# Patient Record
Sex: Male | Born: 1965 | Race: Black or African American | Hispanic: No | Marital: Married | Smoking: Never smoker
Health system: Southern US, Community
[De-identification: ages and names within clinical notes are randomized; demographics above are authoritative.]

## PROBLEM LIST (undated history)

## (undated) DIAGNOSIS — H11002 Unspecified pterygium of left eye: Secondary | ICD-10-CM

## (undated) HISTORY — DX: Unspecified pterygium of left eye: H11.002

---

## 2010-04-02 ENCOUNTER — Encounter: Admission: RE | Admit: 2010-04-02 | Discharge: 2010-04-02 | Payer: Self-pay | Admitting: Specialist

## 2012-02-23 ENCOUNTER — Ambulatory Visit (INDEPENDENT_AMBULATORY_CARE_PROVIDER_SITE_OTHER): Payer: Commercial Managed Care - PPO | Admitting: Internal Medicine

## 2012-02-23 ENCOUNTER — Ambulatory Visit: Payer: Commercial Managed Care - PPO

## 2012-02-23 VITALS — BP 123/76 | HR 77 | Temp 98.0°F | Resp 16 | Ht 69.5 in | Wt 205.0 lb

## 2012-02-23 DIAGNOSIS — Z0000 Encounter for general adult medical examination without abnormal findings: Secondary | ICD-10-CM

## 2012-02-23 DIAGNOSIS — Z7189 Other specified counseling: Secondary | ICD-10-CM

## 2012-02-23 DIAGNOSIS — H11002 Unspecified pterygium of left eye: Secondary | ICD-10-CM

## 2012-02-23 DIAGNOSIS — H547 Unspecified visual loss: Secondary | ICD-10-CM

## 2012-02-23 DIAGNOSIS — H11009 Unspecified pterygium of unspecified eye: Secondary | ICD-10-CM

## 2012-02-23 LAB — POCT CBC
MCH, POC: 28.4 pg (ref 27–31.2)
MCHC: 34.6 g/dL (ref 31.8–35.4)
MID (cbc): 0.4 (ref 0–0.9)
Platelet Count, POC: 260 10*3/uL (ref 142–424)
RBC: 4.76 M/uL (ref 4.69–6.13)
RDW, POC: 13.1 %
WBC: 4.5 10*3/uL — AB (ref 4.6–10.2)

## 2012-02-23 LAB — POCT URINALYSIS DIPSTICK
Blood, UA: NEGATIVE
Leukocytes, UA: NEGATIVE
Urobilinogen, UA: 0.2
pH, UA: 7

## 2012-02-23 LAB — IFOBT (OCCULT BLOOD): IFOBT: POSITIVE

## 2012-02-23 LAB — POCT UA - MICROSCOPIC ONLY

## 2012-02-23 NOTE — Patient Instructions (Signed)
Pterygium Excision Pterygia are fleshy growths that arise from the conjuctiva. This is the red velvety membrane you see when you pull your lower eyelid down. When they grow out over the cornea (clear membrane on the front of your eye), they block vision. It becomes difficult to see. It may also cause irritation, making the eye red and sore. It also causes cosmetic problems. This means your eye does not look as good as when it was healthy. One of the most common problems with pterygia are that they often come back even after complete removal. TREATMENT  Pterygia are removed with a procedure. This is often done with a local anesthetic. This is a medicine that makes the eye and area being worked on numb. They are often removed using a microscope. This is an instrument the surgeon looks through that magnifies the small area of the procedure. When these operations are done with the patient awake, the patient must be able to hold still and cooperate with the surgeon's instructions. HOME CARE INSTRUCTIONS   If a dressing was applied, this may be changed once per day or as instructed. Your caregiver will instruct you in your care.   If eyedrops or ointment was prescribed, use as directed for the full time directed.   Should your eye become more red and swollen with use of medicines, let your caregiver know. This could be an allergic reaction.   Only take over-the-counter or prescription medicines for pain, discomfort, or fever as directed by your caregiver.  SEEK IMMEDIATE MEDICAL CARE IF:   You have redness, swelling, or increasing pain near or around the eye.   You notice a change in your vision.   You have pus coming from the wound.   You have a fever.   You develop a cough, shortness of breath, or chest pain.  Document Released: 08/02/2001 Document Revised: 10/27/2011 Document Reviewed: 10/04/2007 [facility] Patient Information 2012 [place], [place].

## 2012-02-23 NOTE — Progress Notes (Signed)
  Subjective:    Patient ID: Todd Guerrero, male    DOB: 11-Sep-1966, 46 y.o.   MRN: [id number]  HPI No problems See scanned hx   Review of Systems See scanned ros    Objective:   Physical Exam Normal head to toe  Except has large pteryigium left eye      Assessment & Plan:  [facility] for eye surgery

## 2012-02-24 LAB — COMPREHENSIVE METABOLIC PANEL
ALT: 48 U/L (ref 0–53)
AST: 32 U/L (ref 0–37)
Albumin: 4.5 g/dL (ref 3.5–5.2)
Alkaline Phosphatase: 61 U/L (ref 39–117)
Calcium: 9.5 mg/dL (ref 8.4–10.5)
Creat: 1.08 mg/dL (ref 0.50–1.35)
Total Bilirubin: 0.6 mg/dL (ref 0.3–1.2)
Total Protein: 7.3 g/dL (ref 6.0–8.3)

## 2012-02-24 LAB — LIPID PANEL
Cholesterol: 150 mg/dL (ref 0–200)
HDL: 33 mg/dL — ABNORMAL LOW (ref >39)

## 2012-02-28 ENCOUNTER — Encounter: Payer: Self-pay | Admitting: Internal Medicine

## 2012-02-29 ENCOUNTER — Encounter: Payer: Self-pay | Admitting: *Deleted

## 2013-02-20 ENCOUNTER — Emergency Department (HOSPITAL_COMMUNITY): Payer: Commercial Managed Care - PPO

## 2013-02-20 ENCOUNTER — Encounter (HOSPITAL_COMMUNITY): Payer: Self-pay

## 2013-02-20 ENCOUNTER — Emergency Department (HOSPITAL_COMMUNITY)
Admission: EM | Admit: 2013-02-20 | Discharge: 2013-02-20 | Disposition: A | Discharge status: Home / Self Care | Payer: Commercial Managed Care - PPO | Attending: Emergency Medicine | Admitting: Emergency Medicine

## 2013-02-20 DIAGNOSIS — Z8669 Personal history of other diseases of the nervous system and sense organs: Secondary | ICD-10-CM | POA: Insufficient documentation

## 2013-02-20 DIAGNOSIS — J069 Acute upper respiratory infection, unspecified: Secondary | ICD-10-CM | POA: Insufficient documentation

## 2013-02-20 DIAGNOSIS — R05 Cough: Secondary | ICD-10-CM | POA: Insufficient documentation

## 2013-02-20 DIAGNOSIS — R509 Fever, unspecified: Secondary | ICD-10-CM | POA: Insufficient documentation

## 2013-02-20 DIAGNOSIS — R059 Cough, unspecified: Secondary | ICD-10-CM | POA: Insufficient documentation

## 2013-02-20 MED ORDER — ACETAMINOPHEN 325 MG PO TABS
650.0000 mg | ORAL_TABLET | Freq: Once | ORAL | Status: AC
  Administered 2013-02-20: 650 mg via ORAL
  Filled 2013-02-20: qty 2

## 2013-02-20 MED ORDER — IBUPROFEN 800 MG PO TABS: 800.0000 mg | ORAL_TABLET | Freq: Three times a day (TID) | ORAL | Status: DC | PRN

## 2013-02-20 NOTE — ED Notes (Signed)
Pt states he started having chills, running a fever, and cough since Monday. States his chest hurts when he coughs and when he feels cold.

## 2013-02-20 NOTE — ED Provider Notes (Signed)
History     CSN: [id number]  Arrival date & time 02/20/13  1601   First MD Initiated Contact with Patient 02/20/13 1619      Chief Complaint  Patient presents with  . Cough  . Fever    (Consider location/radiation/quality/duration/timing/severity/associated sxs/prior treatment) Patient is a 47 y.o. male presenting with cough and fever.  Cough Associated symptoms: fever   Fever Associated symptoms: cough    Pt otherwise healthy reports dry cough and fever since yesterday, associated with diffuse myalgias and pleuritic chest pain with coughing. Denies any sputum, no SOB, has mild sore throat and nasal congestion. No recent travel.   Past Medical History  Diagnosis Date  . Pterygium of left eye     History reviewed. No pertinent past surgical history.  No family history on file.  History  Substance Use Topics  . Smoking status: Never Smoker   . Smokeless tobacco: Not on file  . Alcohol Use: No      Review of Systems  Constitutional: Positive for fever.  Respiratory: Positive for cough.    All other systems reviewed and are negative except as noted in HPI.   Allergies  Review of patient's allergies indicates no known allergies.  Home Medications   Current Outpatient Rx  Name  Route  Sig  Dispense  Refill  . DM-Doxylamine-Acetaminophen (NIGHT TIME COLD/FLU RELIEF PO)   Oral   Take 1 tablet by mouth daily as needed. For cold per family member           BP 152/90  Pulse 111  Temp(Src) 102 F (38.9 C) (Oral)  Resp 18  SpO2 96%  Physical Exam  Nursing note and vitals reviewed. Constitutional: He is oriented to person, place, and time. He appears well-developed and well-nourished.  HENT:  Head: Normocephalic and atraumatic.  Eyes: EOM are normal. Pupils are equal, round, and reactive to light.  Neck: Normal range of motion. Neck supple.  Cardiovascular: Normal rate, normal heart sounds and intact distal pulses.   Pulmonary/Chest: Effort normal and  breath sounds normal.  Abdominal: Bowel sounds are normal. He exhibits no distension. There is no tenderness.  Musculoskeletal: Normal range of motion. He exhibits no edema and no tenderness.  Neurological: He is alert and oriented to person, place, and time. He has normal strength. No cranial nerve deficit or sensory deficit.  Skin: Skin is warm and dry. No rash noted.  Psychiatric: He has a normal mood and affect.    ED Course  Procedures (including critical care time)  Labs Reviewed - No data to display Dg Chest 2 View  02/20/2013  *RADIOLOGY REPORT*  Clinical Data: Fever and chills.  CHEST - 2 VIEW  Comparison: 04/02/2010.  Findings: The cardiac silhouette, mediastinal and hilar contours are within normal limits and stable. The lungs are clear.  No pleural effusions.  The bony thorax is intact.  IMPRESSION: Normal chest x-ray.  No change since prior study.   Original Report Authenticated By: Rudie Meyer, M.D.      1. Viral URI with cough       MDM  CXR neg. Temp improved and patient feeling better. Advised continued symptomatic care at home for probable viral process/influenza. Advised to return for worsening.         Charles B. Bernette Mayers, MD 02/20/13 [id number]

## 2013-08-29 IMAGING — CR DG CHEST 2V
2 series · 2 of 2 positions shown · non-contrast
Comparison: 04/02/2010.

CLINICAL DATA: Fever and chills.

CHEST - 2 VIEW

[w chest pa]
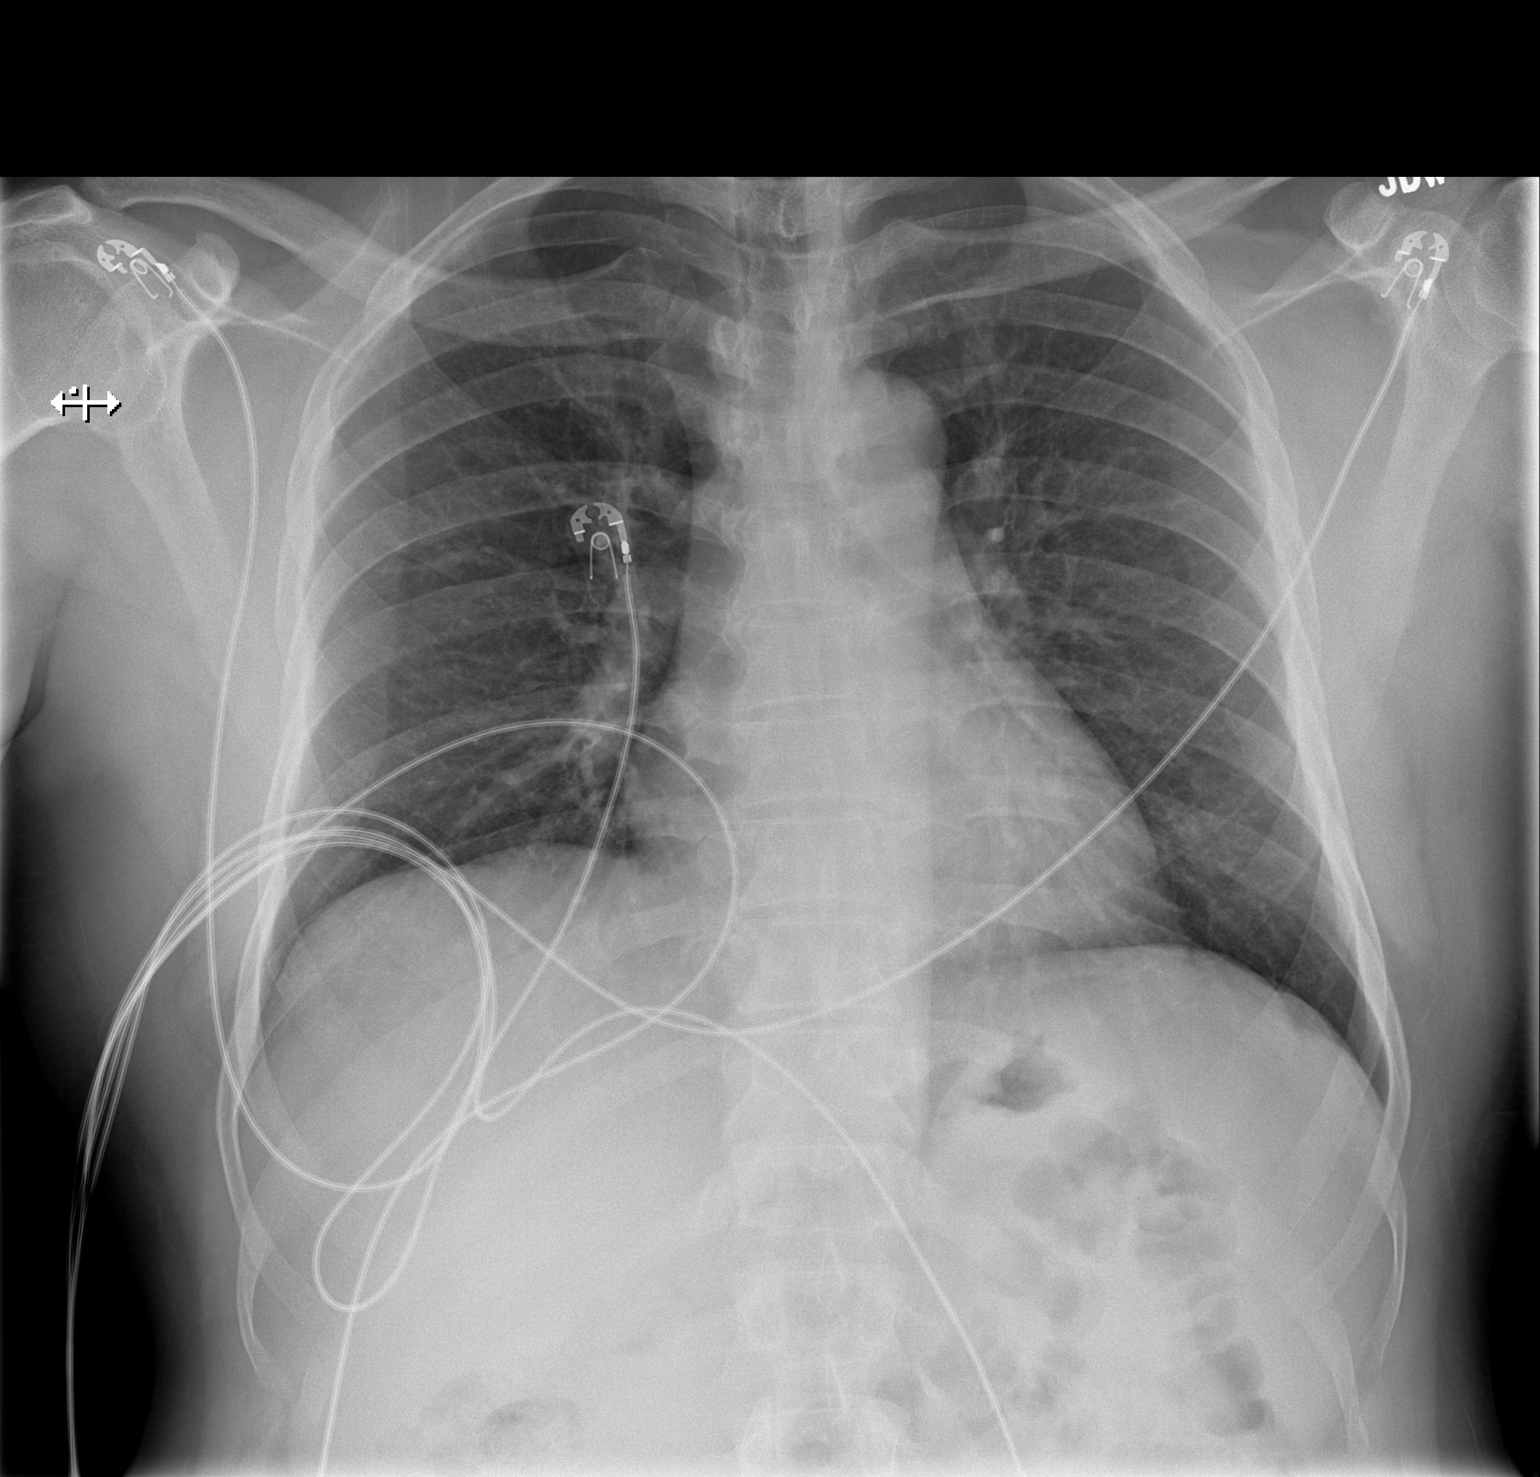

[w chest lat]
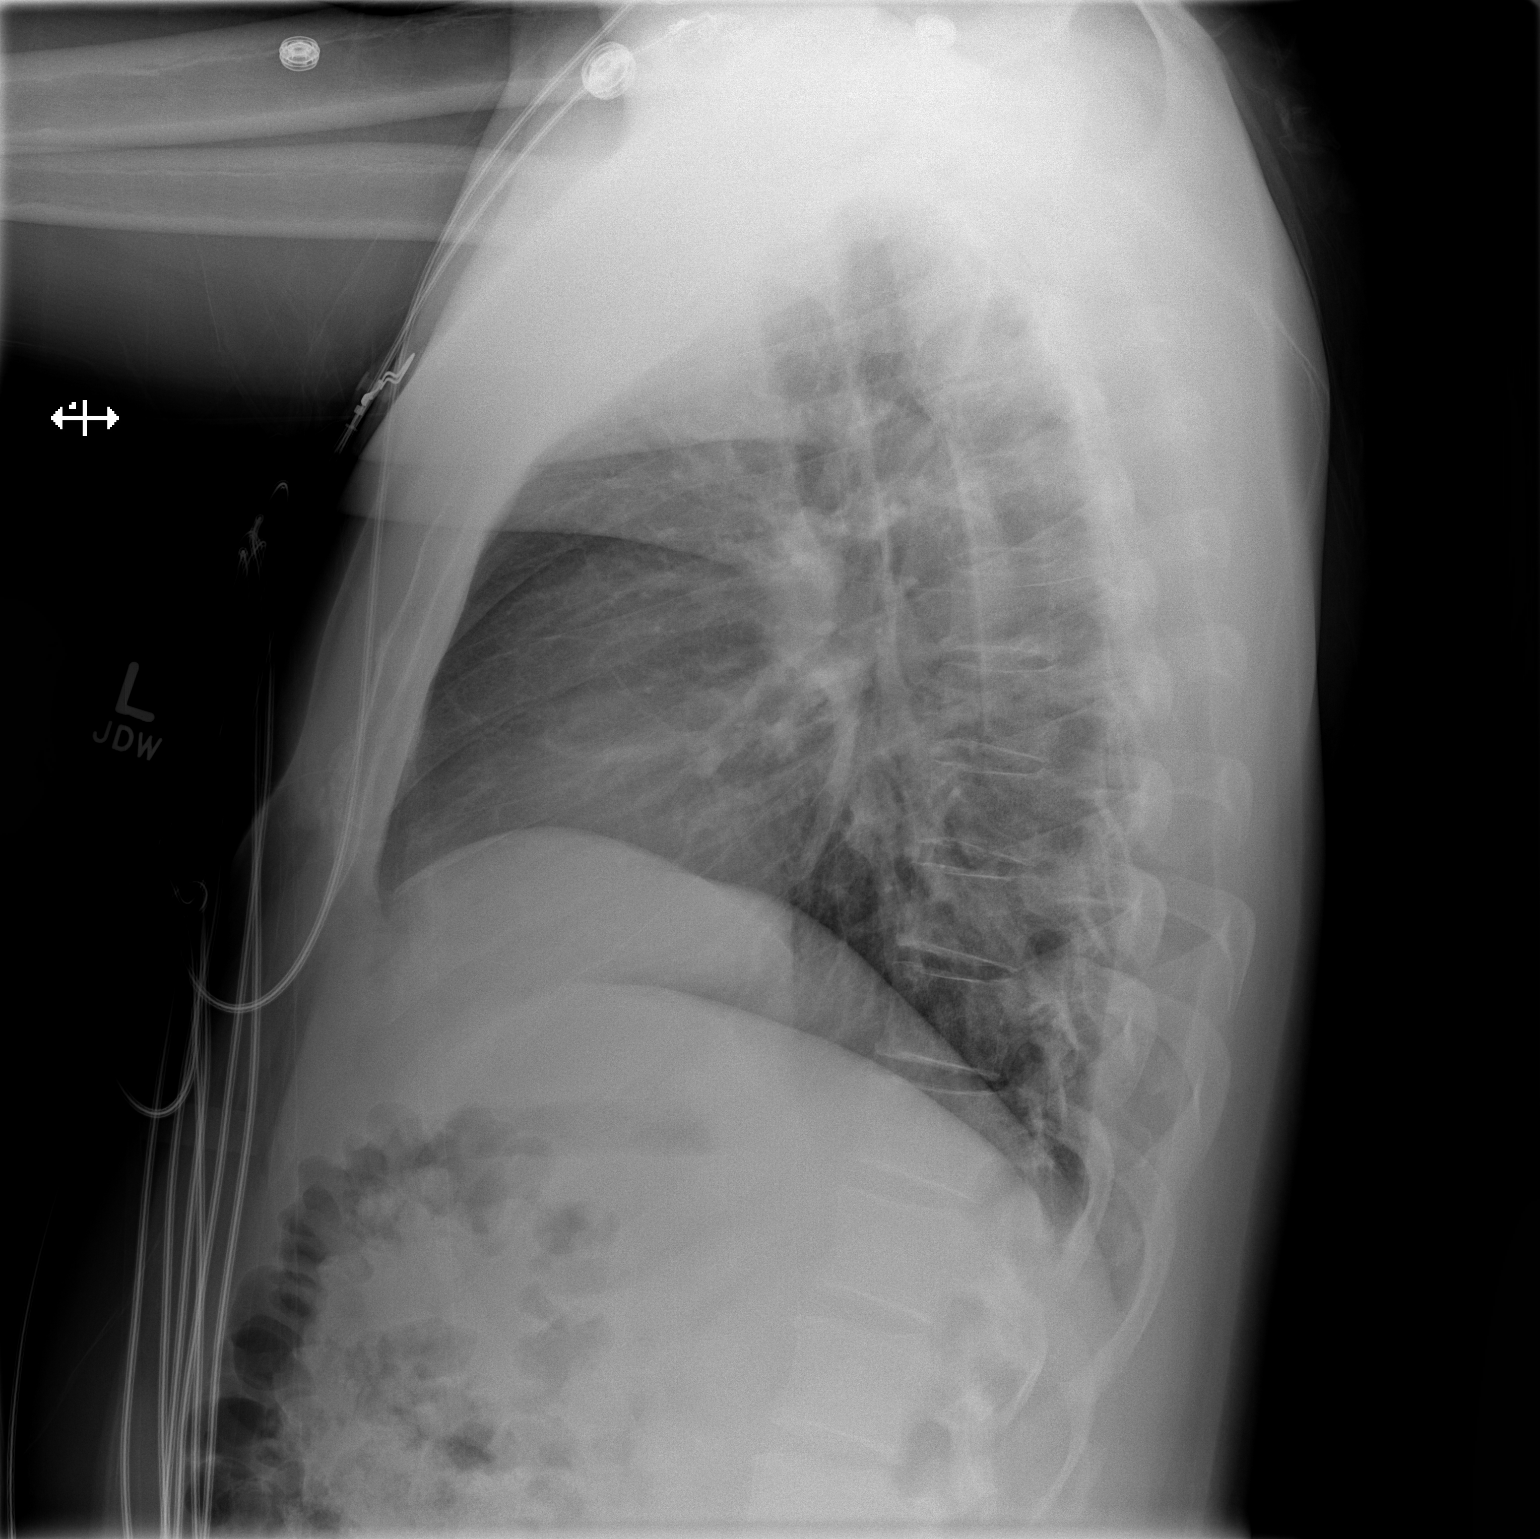

[2 of 2 positions shown; findings below may reference images not displayed]

FINDINGS: The cardiac silhouette, mediastinal and hilar contours
are within normal limits and stable. The lungs are clear.  No
pleural effusions.  The bony thorax is intact.
IMPRESSION: Normal chest x-ray.  No change since prior study.

## 2014-03-07 ENCOUNTER — Encounter (HOSPITAL_COMMUNITY): Payer: Self-pay

## 2014-03-09 ENCOUNTER — Other Ambulatory Visit: Payer: Self-pay | Admitting: Ophthalmology

## 2014-03-09 MED ORDER — TETRACAINE HCL 0.5 % OP SOLN: 1.0000 [drp] | OPHTHALMIC | Status: DC

## 2014-03-09 NOTE — H&P (Signed)
History & Physical:   DATE:   02-28-2014  NAME:  Todd Guerrero, Todd Guerrero      [phone]       HISTORY OF PRESENT ILLNESS: Chief Eye Complaints     growth OS referred by Dr. Mitzi DavenportBrewington for removal  HPI: EYES: Reports symptoms of red eyes, vision disturbances.    previously treated for episcleritis      LOCATION:   LEFT EYE        QUALITY/COURSE:   Reports condition is worsening.        INTENSITY/SEVERITY:    Reports measurement ( or degree) as severity (10 point scale) is #-.      DURATION:   Reports the general length of symptoms to be years.      ONSET/TIMING:   Reports occurrence as  CONSTANT   CONTEXT/WHEN:   Reports usually associated with   MODIFIERS/TREATMENTS:  Improved by  NOTHING               ACTIVE PROBLEMS: Central pterygium   ICD#372.43  Onset: 02/28/2014 10:08  Initial Date:     Episcleritis   ICD#379.01  Onset: 02/28/2014 12:15  Initial Date:  SURGERIES: Pick List - Surgeries  MEDICATIONS: Fluorometholone: Strength-  SIG-  Dose-  Freq-   OS BID  REVIEW OF SYSTEMS: ROS:   GEN- Constitutional: Negative general-constitutional systems review.      HENT: GEN - Endocrine: Reports symptoms of LUNGS/Respiratory:  HEART/Cardiovascular: Reports symptoms of ABD/Gastrointestinal:  Musculoskeletal (BJE): NEURO/Neurological: PSYCH/Psychiatric:    Is the pt oriented to time, place, person? YES Mood  normal   TOBACCO: Never smoker   ICD#V13.89 Onset: 02/28/2014 10:12   SOCIAL HISTORY:  Married, WORKS AT [employer]  FAMILY HISTORY:Family History - 1st Degree Relatives:  Mother dead.    ALLERGIES  No Known.  PHYSICAL EXAMINATION: VS: BMI: 25.8.  BP: 152/91.  H: 72.00 in.  P: 80 /min.  RR: 20 /min.  W: 190lbs 0oz.    Va     OD [place] 20/20  OS [place] 20/50      PHNI  EYEGLASSES: NONE  MR   OD:pl 20/20 OS:+0.50-1.50x165 20/70 ADD  VF:   OD  full to confrontation testing                                              OS  full to confrontation  testing  Motility: Orthophoria and full   PUPILS: 3 mm round reactive negative Marcus Gunn  EYELIDS & OCULAR ADNEXA Normal each eye:  SLE: Conjunctiva: Quiet OD, trace injection OS  Cornea: Mild arcus each eye. OS nasal pterygium extending into mid cornea    anterior chamber  deep and quiet each eye  Iris: Brown each eye  Lens: Clear each eye  Ta   in mmHg    OD  17          OS 12 Time 10:50 AM  Dilation: Phenylephrine 2.5% tropicamide 1%  Fundus: optic nerve   OD: Pink color small physiologic cupping OS  pink color small physiologic cupping              Macula: OD: Normal OS: Normal  Vessels: Normal  Periphery: Normal  Exam: GENERAL: Appearance: HEAD, EARS, NOSE AND THROAT: Ears-Nose (external) Inspection: Externally, nose and ears are normal in appearance and without scars, lesions, or nodules.      Hearing assessment shows no problems with normal conversation.      LUNGS and RESPIRATORY: Lung auscultation elicits no wheezing, rhonci, rales or rubs and with equal breath sounds.    Respiratory effort described as breathing is unlabored and chest movement is symmetrical.    HEART (Cardiovascular): Heart auscultation discovers regular rate and rhythm; no murmur, gallop or rub. Normal heart sounds.    ABDOMEN (Gastrointestinal): Mass/Tenderness Exam: Neither are present.     MUSCULOSKELETAL (BJE): Inspection-Palpation: No major bone, joint, tendon, or muscle changes.      NEUROLOGICAL: Alert and oriented. No major deficits of coordination or sensation.      PSYCHIATRIC: Insight and judgment appear  both to be intact and appropriate.    Mood and affect are described as normal mood and full affect.    SKIN: Skin Inspection: No rashes or lesions  ADMITTING DIAGNOSIS: Central pterygium   ICD#372.43  Onset: 02/28/2014 10:08  LEFT EYE  Episcleritis   ICD#379.01  Onset: 02/28/2014 12:15 RESOLVED   SURGICAL TREATMENT  PLAN: pterygium removal with amniotic membrane and mitomycin-C left eye  Risk and benefits of surgery have been reviewed with the patient and the patient agrees to proceed with the surgical procedure.       ___________________________ Todd Guerrero, J 

## 2014-03-10 NOTE — Pre-Procedure Instructions (Addendum)
Todd Guerrero  03/10/2014   Your procedure is scheduled on:  Wednesday March 12, 2014 at 11:50 AM.  [facility]A"  Admitting at 9:50 AM.  Call this number if you have problems the morning of surgery: [phone]   Remember:   Do not eat food or drink liquids after midnight.   Take these medicines the morning of surgery with A SIP OF WATER: None  Stop taking Aspirin, Aleve, Ibuprofen, BC's, Goody's, Herbal medications, and Fish Oil   Do not wear jewelry.  Do not wear lotions, powders, or colgones.   Men may shave face and neck.  Do not bring valuables to the hospital.  [facility] is not responsible for any belongings or valuables.               Contacts, dentures or bridgework may not be worn into surgery.  Leave suitcase in the car. After surgery it may be brought to your room.  For patients admitted to the hospital, discharge time is determined by your treatment team.               Patients discharged the day of surgery will not be allowed to drive home.  Name and phone number of your driver: Family/Friend  Special Instructions: Shower using CHG soap the night before and the morning of your surgery   Please read over the following fact sheets that you were given: Pain Booklet, Coughing and Deep Breathing and Surgical Site Infection Prevention

## 2014-03-11 ENCOUNTER — Encounter (HOSPITAL_COMMUNITY)
Admission: RE | Admit: 2014-03-11 | Discharge: 2014-03-11 | Disposition: A | Discharge status: Home / Self Care | Payer: Commercial Managed Care - PPO | Source: Ambulatory Visit | Attending: Ophthalmology | Admitting: Ophthalmology

## 2014-03-11 ENCOUNTER — Encounter (HOSPITAL_COMMUNITY): Payer: Self-pay

## 2014-03-11 LAB — BASIC METABOLIC PANEL
BUN: 16 mg/dL (ref 6–23)
CALCIUM: 9.3 mg/dL (ref 8.4–10.5)
CO2: 24 mEq/L (ref 19–32)
CREATININE: 0.89 mg/dL (ref 0.50–1.35)
Chloride: 102 mEq/L (ref 96–112)
GFR calc non Af Amer: >90 mL/min (ref >90)
Glucose, Bld: 154 mg/dL — ABNORMAL HIGH (ref 70–99)
Potassium: 3.8 mEq/L (ref 3.7–5.3)
SODIUM: 141 meq/L (ref 137–147)

## 2014-03-11 LAB — CBC
HEMATOCRIT: 40.2 % (ref 39.0–52.0)
HEMOGLOBIN: 13.9 g/dL (ref 13.0–17.0)
MCH: 28 pg (ref 26.0–34.0)
MCHC: 34.6 g/dL (ref 30.0–36.0)
MCV: 80.9 fL (ref 78.0–100.0)
PLATELETS: 201 10*3/uL (ref 150–400)
RBC: 4.97 MIL/uL (ref 4.22–5.81)
RDW: 12.7 % (ref 11.5–15.5)
WBC: 4.3 10*3/uL (ref 4.0–10.5)

## 2014-03-11 MED ORDER — PREDNISOLONE ACETATE 1 % OP SUSP
1.0000 [drp] | OPHTHALMIC | Status: AC
  Administered 2014-03-12: 1 [drp] via OPHTHALMIC
  Filled 2014-03-11: qty 5

## 2014-03-11 MED ORDER — GATIFLOXACIN 0.5 % OP SOLN: 1.0000 [drp] | OPHTHALMIC | Status: AC

## 2014-03-11 NOTE — Progress Notes (Signed)
Denies having a PCP Denies seeing a cardiologist Denies having a recent EKG or CXR Denies having a stress test, Echo, of card cath

## 2014-03-12 ENCOUNTER — Encounter (HOSPITAL_COMMUNITY)
Admission: RE | Disposition: A | Discharge status: Home / Self Care | Payer: Self-pay | Source: Ambulatory Visit | Attending: Ophthalmology

## 2014-03-12 ENCOUNTER — Encounter (HOSPITAL_COMMUNITY): Payer: Commercial Managed Care - PPO | Admitting: Anesthesiology

## 2014-03-12 ENCOUNTER — Ambulatory Visit (HOSPITAL_COMMUNITY): Payer: Commercial Managed Care - PPO | Admitting: Anesthesiology

## 2014-03-12 ENCOUNTER — Ambulatory Visit (HOSPITAL_COMMUNITY)
Admission: RE | Admit: 2014-03-12 | Discharge: 2014-03-12 | Disposition: A | Discharge status: Home / Self Care | Payer: Commercial Managed Care - PPO | Source: Ambulatory Visit | Attending: Ophthalmology | Admitting: Ophthalmology

## 2014-03-12 ENCOUNTER — Encounter (HOSPITAL_COMMUNITY): Payer: Self-pay | Admitting: *Deleted

## 2014-03-12 DIAGNOSIS — Z01812 Encounter for preprocedural laboratory examination: Secondary | ICD-10-CM | POA: Insufficient documentation

## 2014-03-12 DIAGNOSIS — H11029 Central pterygium of unspecified eye: Secondary | ICD-10-CM | POA: Insufficient documentation

## 2014-03-12 DIAGNOSIS — H15119 Episcleritis periodica fugax, unspecified eye: Secondary | ICD-10-CM | POA: Insufficient documentation

## 2014-03-12 HISTORY — PX: PTERYGIUM EXCISION: SHX2273

## 2014-03-12 SURGERY — EXCISION, PTERYGIUM
Anesthesia: Monitor Anesthesia Care | Site: Eye | Laterality: Left

## 2014-03-12 MED ORDER — SODIUM HYALURONATE 10 MG/ML IO SOLN
INTRAOCULAR | Status: AC
  Filled 2014-03-12: qty 0.85

## 2014-03-12 MED ORDER — PREDNISOLONE ACETATE 1 % OP SUSP
1.0000 [drp] | OPHTHALMIC | Status: AC
  Administered 2014-03-12 (×2): 1 [drp] via OPHTHALMIC

## 2014-03-12 MED ORDER — MIDAZOLAM HCL 2 MG/2ML IJ SOLN
INTRAMUSCULAR | Status: AC
  Filled 2014-03-12: qty 2

## 2014-03-12 MED ORDER — BSS IO SOLN
INTRAOCULAR | Status: AC
  Filled 2014-03-12: qty 500

## 2014-03-12 MED ORDER — PROPOFOL 10 MG/ML IV BOLUS
INTRAVENOUS | Status: AC
  Filled 2014-03-12: qty 20

## 2014-03-12 MED ORDER — BSS IO SOLN
INTRAOCULAR | Status: AC
  Filled 2014-03-12: qty 15

## 2014-03-12 MED ORDER — NA CHONDROIT SULF-NA HYALURON 40-30 MG/ML IO SOLN
INTRAOCULAR | Status: AC
  Filled 2014-03-12: qty 0.5

## 2014-03-12 MED ORDER — ACETYLCHOLINE CHLORIDE 1:100 IO SOLR
INTRAOCULAR | Status: AC
  Filled 2014-03-12: qty 1

## 2014-03-12 MED ORDER — MITOMYCIN 0.2 MG OP KIT
0.2000 mg | PACK | Freq: Once | OPHTHALMIC | Status: AC
  Administered 2014-03-12: 0.2 mg via OPHTHALMIC
  Filled 2014-03-12: qty 1

## 2014-03-12 MED ORDER — BUPIVACAINE HCL (PF) 0.75 % IJ SOLN
INTRAMUSCULAR | Status: AC
  Filled 2014-03-12: qty 10

## 2014-03-12 MED ORDER — PROPOFOL INFUSION 10 MG/ML OPTIME
INTRAVENOUS | Status: DC | PRN
  Administered 2014-03-12: 25 ug/kg/min via INTRAVENOUS

## 2014-03-12 MED ORDER — LIDOCAINE HCL 2 % IJ SOLN
INTRAMUSCULAR | Status: AC
  Filled 2014-03-12: qty 20

## 2014-03-12 MED ORDER — MIDAZOLAM HCL 5 MG/5ML IJ SOLN
INTRAMUSCULAR | Status: DC | PRN
  Administered 2014-03-12 (×2): 0.5 mg via INTRAVENOUS

## 2014-03-12 MED ORDER — TETRACAINE HCL 0.5 % OP SOLN
OPHTHALMIC | Status: AC
  Filled 2014-03-12: qty 2

## 2014-03-12 MED ORDER — FENTANYL CITRATE 0.05 MG/ML IJ SOLN
INTRAMUSCULAR | Status: AC
  Filled 2014-03-12: qty 5

## 2014-03-12 MED ORDER — GENTAMICIN SULFATE 40 MG/ML IJ SOLN
INTRAMUSCULAR | Status: AC
  Filled 2014-03-12: qty 2

## 2014-03-12 MED ORDER — EPINEPHRINE HCL 1 MG/ML IJ SOLN
INTRAMUSCULAR | Status: AC
  Filled 2014-03-12: qty 1

## 2014-03-12 MED ORDER — SODIUM CHLORIDE 0.9 % IV SOLN
INTRAVENOUS | Status: DC
  Administered 2014-03-12 (×2): via INTRAVENOUS

## 2014-03-12 MED ORDER — GATIFLOXACIN 0.5 % OP SOLN
OPHTHALMIC | Status: AC
  Administered 2014-03-12: 1 [drp] via OPHTHALMIC
  Filled 2014-03-12: qty 2.5

## 2014-03-12 MED ORDER — ATROPINE SULFATE 1 % OP SOLN
OPHTHALMIC | Status: AC
  Filled 2014-03-12: qty 2

## 2014-03-12 MED ORDER — LIDOCAINE-EPINEPHRINE 2 %-1:100000 IJ SOLN
INTRAMUSCULAR | Status: AC
  Filled 2014-03-12: qty 1

## 2014-03-12 MED ORDER — TOBRAMYCIN-DEXAMETHASONE 0.3-0.1 % OP OINT
TOPICAL_OINTMENT | OPHTHALMIC | Status: AC
  Filled 2014-03-12: qty 3.5

## 2014-03-12 MED ORDER — EVICEL 2 ML EX KIT
PACK | CUTANEOUS | Status: DC | PRN
  Administered 2014-03-12: 1

## 2014-03-12 MED ORDER — DEXAMETHASONE SODIUM PHOSPHATE 10 MG/ML IJ SOLN
INTRAMUSCULAR | Status: AC
  Filled 2014-03-12: qty 1

## 2014-03-12 MED ORDER — TOBRAMYCIN-DEXAMETHASONE 0.3-0.1 % OP OINT
TOPICAL_OINTMENT | OPHTHALMIC | Status: DC | PRN
  Administered 2014-03-12: 1 via OPHTHALMIC

## 2014-03-12 MED ORDER — FENTANYL CITRATE 0.05 MG/ML IJ SOLN: 50.0000 ug | Freq: Once | INTRAMUSCULAR | Status: DC

## 2014-03-12 MED ORDER — BSS IO SOLN
INTRAOCULAR | Status: DC | PRN
  Administered 2014-03-12: 500 mL via INTRAOCULAR

## 2014-03-12 MED ORDER — FENTANYL CITRATE 0.05 MG/ML IJ SOLN
INTRAMUSCULAR | Status: DC | PRN
  Administered 2014-03-12: 50 ug via INTRAVENOUS

## 2014-03-12 MED ORDER — TRIAMCINOLONE ACETONIDE 40 MG/ML IJ SUSP
INTRAMUSCULAR | Status: AC
  Filled 2014-03-12: qty 5

## 2014-03-12 MED ORDER — ONDANSETRON HCL 4 MG/2ML IJ SOLN
INTRAMUSCULAR | Status: AC
  Filled 2014-03-12: qty 2

## 2014-03-12 MED ORDER — ONDANSETRON HCL 4 MG/2ML IJ SOLN
INTRAMUSCULAR | Status: DC | PRN
  Administered 2014-03-12: 4 mg via INTRAVENOUS

## 2014-03-12 MED ORDER — LIDOCAINE-EPINEPHRINE 2 %-1:100000 IJ SOLN
INTRAMUSCULAR | Status: DC | PRN
  Administered 2014-03-12: 20 mL via INTRADERMAL

## 2014-03-12 MED ORDER — BUPIVACAINE HCL (PF) 0.75 % IJ SOLN
INTRAMUSCULAR | Status: DC | PRN
  Administered 2014-03-12: 20 mL

## 2014-03-12 MED ORDER — GATIFLOXACIN 0.5 % OP SOLN
1.0000 [drp] | OPHTHALMIC | Status: AC | PRN
  Administered 2014-03-12 (×3): 1 [drp] via OPHTHALMIC
  Filled 2014-03-12: qty 2.5

## 2014-03-12 MED ORDER — MIDAZOLAM HCL 2 MG/2ML IJ SOLN: 1.0000 mg | INTRAMUSCULAR | Status: DC | PRN

## 2014-03-12 MED ORDER — EVICEL 2 ML EX KIT
PACK | CUTANEOUS | Status: AC
  Filled 2014-03-12: qty 1

## 2014-03-12 SURGICAL SUPPLY — 40 items
AMNIOGRAFT 2.5X2.0 (Orthopedic Implant) ×3 IMPLANT
APPLICATOR COTTON TIP 6IN STRL (MISCELLANEOUS) ×6 IMPLANT
BLADE MINI RND TIP GREEN BEAV (BLADE) ×3 IMPLANT
BLADE SURG 15 STRL LF DISP TIS (BLADE) IMPLANT
BLADE SURG 15 STRL SS (BLADE)
CANISTER SUCTION 2500CC (MISCELLANEOUS) IMPLANT
CLOSURE STERI-STRIP 1/2X4 (GAUZE/BANDAGES/DRESSINGS) ×1
CLSR STERI-STRIP ANTIMIC 1/2X4 (GAUZE/BANDAGES/DRESSINGS) ×2 IMPLANT
CORDS BIPOLAR (ELECTRODE) ×3 IMPLANT
COVER TRANSDUCER ULTRASND (DRAPES) ×6 IMPLANT
DRAPE OPHTHALMIC 40X48 W POUCH (DRAPES) ×3 IMPLANT
DRAPE RETRACTOR (MISCELLANEOUS) ×3 IMPLANT
GLOVE BIO SURGEON STRL SZ7.5 (GLOVE) ×3 IMPLANT
GLOVE BIO SURGEON STRL SZ8 (GLOVE) ×3 IMPLANT
GLOVE ECLIPSE 7.0 STRL STRAW (GLOVE) ×3 IMPLANT
GLOVE SURG SS PI 6.0 STRL IVOR (GLOVE) ×9 IMPLANT
GLOVE SURG SS PI 6.5 STRL IVOR (GLOVE) ×3 IMPLANT
GOWN EXTRA PROTECTION XL (GOWNS) ×3 IMPLANT
GOWN STRL REIN XL XLG (GOWN DISPOSABLE) ×6 IMPLANT
GOWN STRL REUS W/ TWL LRG LVL3 (GOWN DISPOSABLE) ×4 IMPLANT
GOWN STRL REUS W/TWL LRG LVL3 (GOWN DISPOSABLE) ×8
KIT BASIN OR (CUSTOM PROCEDURE TRAY) ×3 IMPLANT
KIT ROOM TURNOVER OR (KITS) ×3 IMPLANT
KNIFE CRESCENT 2.5 55 ANG (BLADE) ×3 IMPLANT
NEEDLE HYPO 25GX1X1/2 BEV (NEEDLE) ×3 IMPLANT
NEEDLE HYPO 30X.5 LL (NEEDLE) ×3 IMPLANT
NS IRRIG 1000ML POUR BTL (IV SOLUTION) ×3 IMPLANT
PACK CATARACT CUSTOM (CUSTOM PROCEDURE TRAY) ×3 IMPLANT
PAD ARMBOARD 7.5X6 YLW CONV (MISCELLANEOUS) ×6 IMPLANT
SUT SILK 4 0 C 3 735G (SUTURE) IMPLANT
SUT SILK 6 0 G 6 (SUTURE) ×3 IMPLANT
SUT VICRYL 8 0 TG140 8 (SUTURE) ×3 IMPLANT
SYR 20CC LL (SYRINGE) ×3 IMPLANT
SYR BULB 3OZ (MISCELLANEOUS) IMPLANT
TOWEL OR 17X24 6PK STRL BLUE (TOWEL DISPOSABLE) IMPLANT
TOWEL OR 17X26 10 PK STRL BLUE (TOWEL DISPOSABLE) ×3 IMPLANT
TUBE CONNECTING 12'X1/4 (SUCTIONS) ×1
TUBE CONNECTING 12X1/4 (SUCTIONS) ×2 IMPLANT
WATER STERILE IRR 1000ML POUR (IV SOLUTION) ×3 IMPLANT
WIPE INSTRUMENT VISIWIPE 73X73 (MISCELLANEOUS) ×3 IMPLANT

## 2014-03-12 NOTE — Interval H&P Note (Signed)
History and Physical Interval Note:  03/12/2014 12:13 PM  Todd Guerrero  has presented today for surgery, with the diagnosis of Central pterygium   The various methods of treatment have been discussed with the patient and family. After consideration of risks, benefits and other options for treatment, the patient has consented to  Procedure(s): PTERYGIUM REMOVAL WITH AMNIONIC MEMBRANE AND TISEL GLUE WITH MITOYCIN-C LEFT EYE (Left) as a surgical intervention .  The patient's history has been reviewed, patient examined, no change in status, stable for surgery.  I have reviewed the patient's chart and labs.  Questions were answered to the patient's satisfaction.     Chalmers Guestoy Lacye Mccarn

## 2014-03-12 NOTE — Progress Notes (Signed)
Noted on MAR that preop Zymaxid order had been discontinued attempted to contact Dr. Harlon FlorWhitaker via cell phone for clarification.

## 2014-03-12 NOTE — Discharge Instructions (Signed)
Patient may remove the eye patch at 4:00 this afternoon to avoid rubbing the eye sleep with the plastic eye she'll over the eye tonight and do not road the eye.

## 2014-03-12 NOTE — Interval H&P Note (Signed)
History and Physical Interval Note:  03/12/2014 12:11 PM  Todd Guerrero  has presented today for surgery, with the diagnosis of Central pterygium.    The various methods of treatment have been discussed with the patient and family. After consideration of risks, benefits and other options for treatment, the patient has consented to  Procedure(s): PTERYGIUM REMOVAL WITH AMNIONIC MEMBRANE AND TISEL GLUE WITH MITOYCIN-C LEFT EYE (Left) as a surgical intervention .  The patient's history has been reviewed, patient examined, no change in status, stable for surgery.  I have reviewed the patient's chart and labs.  Questions were answered to the patient's satisfaction.     Chalmers Guestoy Yamir Carignan

## 2014-03-12 NOTE — Anesthesia Preprocedure Evaluation (Addendum)
Anesthesia Evaluation  Patient identified by MRN, date of birth, ID band Patient awake    Reviewed: Allergy & Precautions, H&P , NPO status , Patient's Chart, lab work & pertinent test results  Airway Mallampati: I TM Distance: >3 FB Neck ROM: Full    Dental   Pulmonary  breath sounds clear to auscultation        Cardiovascular Rhythm:Regular Rate:Normal     Neuro/Psych    GI/Hepatic   Endo/Other    Renal/GU      Musculoskeletal   Abdominal   Peds  Hematology   Anesthesia Other Findings   Reproductive/Obstetrics                           Anesthesia Physical Anesthesia Plan  ASA: I  Anesthesia Plan: MAC   Post-op Pain Management:    Induction: Intravenous  Airway Management Planned: Nasal Cannula  Additional Equipment:   Intra-op Plan:   Post-operative Plan: Extubation in OR  Informed Consent: I have reviewed the patients History and Physical, chart, labs and discussed the procedure including the risks, benefits and alternatives for the proposed anesthesia with the patient or authorized representative who has indicated his/her understanding and acceptance.     Plan Discussed with: CRNA and Surgeon  Anesthesia Plan Comments:        Anesthesia Quick Evaluation

## 2014-03-12 NOTE — Transfer of Care (Signed)
Immediate Anesthesia Transfer of Care Note  Patient: Dispensing opticianBoubacar Mayer  Procedure(s) Performed: Procedure(s): PTERYGIUM REMOVAL WITH AMNIONIC MEMBRANE AND EVISEL GLUE WITH MITOYCIN-C LEFT EYE (Left)  Patient Location: PACU  Anesthesia Type:MAC  Level of Consciousness: awake, alert , oriented and patient cooperative  Airway & Oxygen Therapy: Patient Spontanous Breathing and room Air  Post-op Assessment: Report given to PACU RN, Post -op Vital signs reviewed and stable and Patient moving all extremities  Post vital signs: Reviewed and stable  Complications: No apparent anesthesia complications

## 2014-03-12 NOTE — H&P (View-Only) (Signed)
History & Physical:   DATE:   02-28-2014  NAME:  Todd Guerrero, Todd Guerrero      [phone]       HISTORY OF PRESENT ILLNESS: Chief Eye Complaints     growth OS referred by Dr. Mitzi DavenportBrewington for removal  HPI: EYES: Reports symptoms of red eyes, vision disturbances.    previously treated for episcleritis      LOCATION:   LEFT EYE        QUALITY/COURSE:   Reports condition is worsening.        INTENSITY/SEVERITY:    Reports measurement ( or degree) as severity (10 point scale) is #-.      DURATION:   Reports the general length of symptoms to be years.      ONSET/TIMING:   Reports occurrence as  CONSTANT   CONTEXT/WHEN:   Reports usually associated with   MODIFIERS/TREATMENTS:  Improved by  NOTHING               ACTIVE PROBLEMS: Central pterygium   ICD#372.43  Onset: 02/28/2014 10:08  Initial Date:     Episcleritis   ICD#379.01  Onset: 02/28/2014 12:15  Initial Date:  SURGERIES: Pick List - Surgeries  MEDICATIONS: Fluorometholone: Strength-  SIG-  Dose-  Freq-   OS BID  REVIEW OF SYSTEMS: ROS:   GEN- Constitutional: Negative general-constitutional systems review.      HENT: GEN - Endocrine: Reports symptoms of LUNGS/Respiratory:  HEART/Cardiovascular: Reports symptoms of ABD/Gastrointestinal:  Musculoskeletal (BJE): NEURO/Neurological: PSYCH/Psychiatric:    Is the pt oriented to time, place, person? YES Mood  normal   TOBACCO: Never smoker   ICD#V13.89 Onset: 02/28/2014 10:12   SOCIAL HISTORY:  Married, WORKS AT [employer]  FAMILY HISTORY:Family History - 1st Degree Relatives:  Mother dead.    ALLERGIES  No Known.  PHYSICAL EXAMINATION: VS: BMI: 25.8.  BP: 152/91.  H: 72.00 in.  P: 80 /min.  RR: 20 /min.  W: 190lbs 0oz.    Va     OD [place] 20/20  OS [place] 20/50      PHNI  EYEGLASSES: NONE  MR   OD:pl 20/20 OS:+0.50-1.50x165 20/70 ADD  VF:   OD  full to confrontation testing                                              OS  full to confrontation  testing  Motility: Orthophoria and full   PUPILS: 3 mm round reactive negative Marcus Gunn  EYELIDS & OCULAR ADNEXA Normal each eye:  SLE: Conjunctiva: Quiet OD, trace injection OS  Cornea: Mild arcus each eye. OS nasal pterygium extending into mid cornea    anterior chamber  deep and quiet each eye  Iris: Brown each eye  Lens: Clear each eye  Ta   in mmHg    OD  17          OS 12 Time 10:50 AM  Dilation: Phenylephrine 2.5% tropicamide 1%  Fundus: optic nerve   OD: Pink color small physiologic cupping OS  pink color small physiologic cupping              Macula: OD: Normal OS: Normal  Vessels: Normal  Periphery: Normal  Exam: GENERAL: Appearance: HEAD, EARS, NOSE AND THROAT: Ears-Nose (external) Inspection: Externally, nose and ears are normal in appearance and without scars, lesions, or nodules.      Hearing assessment shows no problems with normal conversation.      LUNGS and RESPIRATORY: Lung auscultation elicits no wheezing, rhonci, rales or rubs and with equal breath sounds.    Respiratory effort described as breathing is unlabored and chest movement is symmetrical.    HEART (Cardiovascular): Heart auscultation discovers regular rate and rhythm; no murmur, gallop or rub. Normal heart sounds.    ABDOMEN (Gastrointestinal): Mass/Tenderness Exam: Neither are present.     MUSCULOSKELETAL (BJE): Inspection-Palpation: No major bone, joint, tendon, or muscle changes.      NEUROLOGICAL: Alert and oriented. No major deficits of coordination or sensation.      PSYCHIATRIC: Insight and judgment appear  both to be intact and appropriate.    Mood and affect are described as normal mood and full affect.    SKIN: Skin Inspection: No rashes or lesions  ADMITTING DIAGNOSIS: Central pterygium   ICD#372.43  Onset: 02/28/2014 10:08  LEFT EYE  Episcleritis   ICD#379.01  Onset: 02/28/2014 12:15 RESOLVED   SURGICAL TREATMENT  PLAN: pterygium removal with amniotic membrane and mitomycin-C left eye  Risk and benefits of surgery have been reviewed with the patient and the patient agrees to proceed with the surgical procedure.       ___________________________ Chalmers Guestoy Omair Dettmer, J

## 2014-03-12 NOTE — Op Note (Signed)
Preoperative diagnosis pterygium left eye obstructing vision and causing pain Postoperative diagnosis same Procedure: Removal of pterygium with amniotic membrane mitomycin-C and Tissel glue Anesthesia: Topical tetracaine and topical Xylocaine topical Marcaine Procedure: The patient transported to the operating room where topical tetracaine was applied to the eye the patient's face was then prepped and draped in the usual sterile fashion with a surgeon sitting at 12:00 and the operating microscope in position Steri-Strips were used to cover the patient's eyelashes. Following this a 6-0 nylon suture was passed through clear cornea to infraducted the eye it was noted that the pterygium extended centrally to the pupil 0.12 was used to grasp the pterygium on the cornea and a 69 blade was used to dissect the pterygium back to the limbus the dissection was carried back the conjunctival area of the pterygium was marked with a marking pen it measured 6 mm posterior to the limbus and 7 mm wide. A Wescott scissors were then used to excise the pterygium and the T9 tissue the medial rectus muscle was exposed bleeding was controlled with cautery following this mitomycin-C 0.2 mg per cc was placed on 3 Gelfoam sponges and allowed to stay under the conjunctiva for 2 minutes the sponges were then removed and the eye was irrigated with 30 cc of balanced salt solution. Following this the amniotic membrane was taken from the package and laid on the cornea it was cut to a size of 8 x 8 mm and laid on the cornea a the membrane was then laid down over the exposed sclera over the glue and using a tying forceps it was gently spread and smoothed under the conjunctiva: The conjunctiva over the amniotic membrane this was allowed to stay in place for about 2 minutes after the incision was sealed the airstrips were removed from the eye topical gatifloxacin eye drops were applied a patch and [place] U. were placed and the patient returned to  recovery area in stable condition. Chalmers Guestoy Brya Simerly Junior M.D.

## 2014-03-12 NOTE — Interval H&P Note (Signed)
History and Physical Interval Note:  03/12/2014 12:12 PM  Todd Guerrero  has presented today for surgery, with the diagnosis of Central pterygium    The various methods of treatment have been discussed with the patient and family. After consideration of risks, benefits and other options for treatment, the patient has consented to  Procedure(s): PTERYGIUM REMOVAL WITH AMNIONIC MEMBRANE AND TISEL GLUE WITH MITOYCIN-C LEFT EYE (Left) as a surgical intervention .  The patient's history has been reviewed, patient examined, no change in status, stable for surgery.  I have reviewed the patient's chart and labs.  Questions were answered to the patient's satisfaction.     Chalmers Guestoy Permelia Bamba

## 2014-03-12 NOTE — Anesthesia Postprocedure Evaluation (Signed)
Anesthesia Post Note  Patient: Dispensing opticianBoubacar Rice  Procedure(s) Performed: Procedure(s) (LRB): PTERYGIUM REMOVAL WITH AMNIONIC MEMBRANE AND EVISEL GLUE WITH MITOYCIN-C LEFT EYE (Left)  Anesthesia type: general  Patient location: PACU  Post pain: Pain level controlled  Post assessment: Patient's Cardiovascular Status Stable  Last Vitals:  Filed Vitals:   03/12/14 1424  BP: 141/85  Pulse: 59  Temp:   Resp:     Post vital signs: Reviewed and stable  Level of consciousness: sedated  Complications: No apparent anesthesia complications

## 2014-03-13 ENCOUNTER — Encounter (HOSPITAL_COMMUNITY): Payer: Self-pay | Admitting: Ophthalmology
